# Patient Record
Sex: Female | Born: 2005 | Race: White | Hispanic: No | Marital: Single | Smoking: Never smoker
Health system: Southern US, Community
[De-identification: ages and names within clinical notes are randomized; demographics above are authoritative.]

## PROBLEM LIST (undated history)

## (undated) DIAGNOSIS — G43909 Migraine, unspecified, not intractable, without status migrainosus: Secondary | ICD-10-CM

## (undated) HISTORY — DX: Migraine, unspecified, not intractable, without status migrainosus: G43.909

---
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
## 2023-01-18 ENCOUNTER — Telehealth: Payer: Self-pay

## 2023-01-18 NOTE — Telephone Encounter (Signed)
Pt calling; was seen in Jan to have nexplanon taken out and depo started; Wed of last week she went to the br and got into the shower and a lot of blood and clots came out of her; she changes her tampon q1h; it if old blood on tampon and not bright red; she is not bleeding heavy as tampon isn't filled up; she is still bleeding.  Adv it will take her body a while to adjust to the depo and irreg bleeding can be expected; to monitor and the bleeding; we don't want her to fill up a pad every 70mn to one hour; if she does she needs to go to the ER.  Pt states again of how she bled last Wed in the shower and is still bleeding; adv again that was last Wed and not now to monitor the bleeding.  Pt said, "okay, never mind".

## 2023-02-25 ENCOUNTER — Ambulatory Visit (INDEPENDENT_AMBULATORY_CARE_PROVIDER_SITE_OTHER): Payer: Medicaid Other

## 2023-02-25 VITALS — BP 102/65 | HR 56 | Resp 16 | Ht 60.0 in | Wt 107.3 lb

## 2023-02-25 DIAGNOSIS — Z3042 Encounter for surveillance of injectable contraceptive: Secondary | ICD-10-CM | POA: Diagnosis not present

## 2023-02-25 MED ORDER — MEDROXYPROGESTERONE ACETATE 150 MG/ML IM SUSP
150.0000 mg | Freq: Once | INTRAMUSCULAR | Status: AC
  Administered 2023-02-25: 150 mg via INTRAMUSCULAR

## 2023-02-25 NOTE — Progress Notes (Signed)
    NURSE VISIT NOTE  Subjective:    Patient ID: Elizabeth Mckenzie, female    DOB: 12/31/2005, 17 y.o.   MRN: [id number]  HPI  Patient is a 17 y.o. G0P0000 female who presents for depo provera injection.   Objective:    There were no vitals taken for this visit.  Last Annual: None/Adolescent. Last pap: Not age appropriate. Last Depo-Provera: 12/03/2022. Side Effects if any: none. Serum HCG indicated? No . Depo-Provera 150 mg IM given by: Cristy Folks, CMA. Site: Right Deltoid  Lab Review  @THIS  VISIT ONLY@  Assessment:   1. Encounter for surveillance of injectable contraceptive      Plan:   Next appointment due between June 20 and July 4.    Chilton Greathouse, [facility]

## 2023-02-25 NOTE — Patient Instructions (Signed)

## 2023-03-10 ENCOUNTER — Other Ambulatory Visit: Payer: Self-pay | Admitting: Physician Assistant

## 2023-03-10 DIAGNOSIS — R51 Headache with orthostatic component, not elsewhere classified: Secondary | ICD-10-CM

## 2023-03-10 DIAGNOSIS — R519 Headache, unspecified: Secondary | ICD-10-CM

## 2023-03-10 DIAGNOSIS — R202 Paresthesia of skin: Secondary | ICD-10-CM

## 2023-03-22 ENCOUNTER — Ambulatory Visit
Admission: RE | Admit: 2023-03-22 | Discharge: 2023-03-22 | Disposition: A | Discharge status: Home / Self Care | Payer: Medicaid Other | Source: Ambulatory Visit | Attending: Physician Assistant | Admitting: Physician Assistant

## 2023-03-22 DIAGNOSIS — R519 Headache, unspecified: Secondary | ICD-10-CM

## 2023-03-22 DIAGNOSIS — R202 Paresthesia of skin: Secondary | ICD-10-CM

## 2023-03-22 DIAGNOSIS — R51 Headache with orthostatic component, not elsewhere classified: Secondary | ICD-10-CM

## 2023-03-22 MED ORDER — GADOPICLENOL 0.5 MMOL/ML IV SOLN
5.0000 mL | Freq: Once | INTRAVENOUS | Status: AC | PRN
  Administered 2023-03-22: 5 mL via INTRAVENOUS

## 2023-05-20 ENCOUNTER — Ambulatory Visit (INDEPENDENT_AMBULATORY_CARE_PROVIDER_SITE_OTHER): Payer: Medicaid Other

## 2023-05-20 VITALS — BP 118/62 | HR 80 | Wt 110.6 lb

## 2023-05-20 DIAGNOSIS — Z3042 Encounter for surveillance of injectable contraceptive: Secondary | ICD-10-CM | POA: Diagnosis not present

## 2023-05-20 MED ORDER — MEDROXYPROGESTERONE ACETATE 150 MG/ML IM SUSP
150.0000 mg | Freq: Once | INTRAMUSCULAR | Status: AC
  Administered 2023-05-20: 150 mg via INTRAMUSCULAR

## 2023-05-20 NOTE — Progress Notes (Signed)
    NURSE VISIT NOTE  Subjective:    Patient ID: Elizabeth Mckenzie, female    DOB: 04/05/2006, 17 y.o.   MRN: [id number]  HPI  Patient is a 17 y.o. G0P0000 female who presents for depo provera injection.   Objective:    BP 118/62   Pulse 80   Wt 110 lb 9.6 oz (50.2 kg)   BMI 21.60 kg/m   Last Annual: N/A. Last pap: N/A. Last Depo-Provera: 02/25/2023. Side Effects if any: none. Serum HCG indicated? No . Depo-Provera 150 mg IM given by: Sheliah Hatch, CMA. Site: Left Deltoid  Lab Review    Assessment:   1. Encounter for surveillance of injectable contraceptive      Plan:   Next appointment due between 08/05/23 and 08/19/23.    Fonda Kinder, CMA

## 2023-05-20 NOTE — Patient Instructions (Signed)

## 2023-08-12 ENCOUNTER — Ambulatory Visit: Payer: MEDICAID

## 2023-08-12 VITALS — BP 95/56 | HR 62 | Ht 60.0 in | Wt 110.2 lb

## 2023-08-12 DIAGNOSIS — Z3042 Encounter for surveillance of injectable contraceptive: Secondary | ICD-10-CM | POA: Diagnosis not present

## 2023-08-12 MED ORDER — MEDROXYPROGESTERONE ACETATE 150 MG/ML IM SUSP
150.0000 mg | Freq: Once | INTRAMUSCULAR | Status: AC
Start: 2023-08-12 — End: 2023-08-12
  Administered 2023-08-12: 150 mg via INTRAMUSCULAR

## 2023-08-12 NOTE — Progress Notes (Signed)
NURSE VISIT NOTE  Subjective:    Patient ID: Elizabeth Mckenzie, female    DOB: 2005/12/09, 17 y.o.   MRN: [id number]  HPI  Patient is a 17 y.o. G0P0000 female who presents for depo provera injection.   Objective:    BP (!) 95/56   Pulse 62   Ht 5' (1.524 m)   Wt 110 lb 3.2 oz (50 kg)   BMI 21.52 kg/m   Last Annual: 12/03/22. Last pap: n/a due to age. Last Depo-Provera: 05/20/23. Side Effects if any: n/a. Serum HCG indicated? N/a. Depo-Provera 150 mg IM given by: Georgiana Shore, CMA. Site: Left Deltoid   Assessment:   1. Encounter for surveillance of injectable contraceptive      Plan:   Next appointment due between 10/28/23 and 11/11/23.    Loman Chroman, CMA

## 2023-11-03 ENCOUNTER — Ambulatory Visit: Payer: BC Managed Care – PPO

## 2023-11-03 VITALS — BP 114/77 | HR 76 | Ht 60.0 in | Wt 113.0 lb

## 2023-11-03 DIAGNOSIS — Z3042 Encounter for surveillance of injectable contraceptive: Secondary | ICD-10-CM

## 2023-11-03 DIAGNOSIS — R300 Dysuria: Secondary | ICD-10-CM | POA: Diagnosis not present

## 2023-11-03 LAB — POCT URINALYSIS DIPSTICK
Bilirubin, UA: NEGATIVE
Blood, UA: NEGATIVE
Glucose, UA: NEGATIVE
Ketones, UA: NEGATIVE
Leukocytes, UA: NEGATIVE
Nitrite, UA: NEGATIVE
Protein, UA: POSITIVE — AB
Spec Grav, UA: 1.025 (ref 1.010–1.025)
Urobilinogen, UA: 0.2 U/dL
pH, UA: 6.5 (ref 5.0–8.0)

## 2023-11-03 MED ORDER — MEDROXYPROGESTERONE ACETATE 150 MG/ML IM SUSY
150.0000 mg | PREFILLED_SYRINGE | Freq: Once | INTRAMUSCULAR | Status: AC
Start: 2023-11-03 — End: 2023-11-03
  Administered 2023-11-03: 150 mg via INTRAMUSCULAR

## 2023-11-03 NOTE — Progress Notes (Signed)
    NURSE VISIT NOTE  Subjective:    Patient ID: Elizabeth Mckenzie, female    DOB: [birth year], 17 y.o.   MRN: [id number]  HPI  Patient is a 17 y.o. G0P0000 female who presents for depo provera injection.   Objective:    There were no vitals taken for this visit.  Last Annual: 12/03/22. Last pap: N/A. Last Depo-Provera: 08/12/2023 Side Effects if any: none. Serum HCG indicated? No . Depo-Provera 150 mg IM given by: Beverely Pace, CMA. Site: Right Deltoid While pt was here she sated she was having pain with urination asking for a uti check states she also keeps protein in urine and has referral with Uro, she will call pcp to check on that referral sent urine culture to lab .    Assessment:   1. Surveillance for Depo-Provera contraception   2. Dysuria      Plan:   Next appointment due between FEB-26-MAR-12    Loney Laurence, CMA

## 2023-11-05 LAB — URINE CULTURE

## 2024-01-26 ENCOUNTER — Ambulatory Visit: Payer: BC Managed Care – PPO

## 2024-02-02 ENCOUNTER — Ambulatory Visit: Payer: BC Managed Care – PPO

## 2024-02-02 VITALS — BP 97/56 | HR 63 | Ht 61.0 in | Wt 120.1 lb

## 2024-02-02 DIAGNOSIS — Z3042 Encounter for surveillance of injectable contraceptive: Secondary | ICD-10-CM | POA: Diagnosis not present

## 2024-02-02 MED ORDER — MEDROXYPROGESTERONE ACETATE 150 MG/ML IM SUSP
150.0000 mg | Freq: Once | INTRAMUSCULAR | Status: AC
Start: 2024-02-02 — End: 2024-02-02
  Administered 2024-02-02: 150 mg via INTRAMUSCULAR

## 2024-02-02 NOTE — Progress Notes (Signed)
    NURSE VISIT NOTE  Subjective:    Patient ID: Elizabeth Mckenzie, female    DOB: 2006-05-28, 18 y.o.   MRN: [id number]  HPI  Patient is a 18 y.o. G0P0000 female who presents for depo provera injection.   Objective:    BP (!) 97/56   Pulse 63   Ht 5\' 1"  (1.549 m)   Wt 120 lb 1.6 oz (54.5 kg)   BMI 22.69 kg/m   Last Annual: N/A. Last pap: N/A. Last Depo-Provera: 11/03/23. Side Effects if any: none. Serum HCG indicated? No . Depo-Provera 150 mg IM given by: Sheliah Hatch, CMA. Site: Left Deltoid  Lab Review    Assessment:   1. Surveillance for Depo-Provera contraception      Plan:   Next appointment due between 04/19/2024 and 05/03/2024.    Fonda Kinder, CMA

## 2024-02-02 NOTE — Patient Instructions (Signed)

## 2024-04-26 ENCOUNTER — Ambulatory Visit

## 2024-08-21 NOTE — Progress Notes (Signed)
" ° ° °  UROLOGY PROCEDURE VISIT  Procedure:  Local Cystourethroscopy  Code: Cystoscopy, CPT 52000    Indication: 18 y.o. female here for gross hematuria, last imaging 06/2024 without evidence of stones or hydronephrosis (non-contrast study), no previous cytology . On further review of patient history, she provided pictures demonstrating clot-like material in the toilet with clear yellow urine, reports no history of true gross hematuria. Has had menstrual cycle abnormalities followed by GYN.  Description:  Time out was performed immediately prior to the procedure.   The patient was prepped and draped in the usual sterile fashion in the relaxed dorsal lithotomy position.  Flexible cystoscopy was performed.  The urethral meatus and urethra were normal.  The bladder appeared smooth, pale, and uniformly pink, with no evidence of tumors, polyps, or other abnormalities detected. Ureteral orifices were visualized bilaterally.  Saline barbotage was performed.  The patient tolerated the procedure well, and she did not require prophylactic antibiotics according to AUA guidelines.SABRA  Specimen: none  Assessment: Normal cystoscopy  Plan:   -Presentation more consistent with GYN bleeding than gross hematuria, normal cystoscopy -No follow up indicated  Arthea Rail, MD Department of Urology  "

## 2024-11-03 ENCOUNTER — Ambulatory Visit: Admitting: Licensed Practical Nurse

## 2024-11-24 ENCOUNTER — Ambulatory Visit (INDEPENDENT_AMBULATORY_CARE_PROVIDER_SITE_OTHER): Admitting: Licensed Practical Nurse

## 2024-11-24 ENCOUNTER — Other Ambulatory Visit (HOSPITAL_COMMUNITY)
Admission: RE | Admit: 2024-11-24 | Discharge: 2024-11-24 | Disposition: A | Discharge status: Home / Self Care | Source: Ambulatory Visit | Attending: Licensed Practical Nurse | Admitting: Licensed Practical Nurse

## 2024-11-24 ENCOUNTER — Encounter: Payer: Self-pay | Admitting: Licensed Practical Nurse

## 2024-11-24 VITALS — BP 109/71 | HR 83 | Wt 106.7 lb

## 2024-11-24 DIAGNOSIS — Z113 Encounter for screening for infections with a predominantly sexual mode of transmission: Secondary | ICD-10-CM | POA: Diagnosis present

## 2024-11-24 DIAGNOSIS — N926 Irregular menstruation, unspecified: Secondary | ICD-10-CM

## 2024-11-24 NOTE — Progress Notes (Signed)
 "   [name], MD   No chief complaint on file.   HPI:      Elizabeth Mckenzie is a 19 y.o. G0P0000 whose LMP was Patient's last menstrual period was 11/10/2024 (approximate)., presents today for hormone check saw something on tik tok that  scared her, wonders if she has PCOS because her cycles are weird. Also desires STD screening.    Menarche 5th grade When not on hormones cycles were heavy and  has significant cramping   Was on Depo Jan 2024 through March 2025, did not get cycle June July, August or September 2025, passed blood clots a few times a week while on Depo,  Cycles returned in October.   Oct 19-15, Nov 1-4, Nov 22-25, Dec 19-23, flow varies and number of menstrual days vary, gets cramp in her back and bloating around the time of her cycle, also bloats often, has increased fruits and veggies,  eats burgers, chicken, drinks milk and eat sweats, Has 3-4 BM daily, stool is loose sometimes needs to strain  PCP [facility] in [place]   Sexually active with 1 female partner, does not desires a pregnancy but is not using contraception, is allergic to latex   Denies excessive facial hair, deepening of voice, or female pattern baldness  Normal Pelvic US  11/2022    -Has decreased sedx dirve since stopping birth control  Sleep is disturbed-wakes in the middle of night often  Works at [employer] and goes to school for business      There are no active problems to display for this patient.   No past surgical history on file.  Family History  Problem Relation Age of Onset   Hypertension Maternal Grandmother    Breast cancer Neg Hx    Ovarian cancer Neg Hx    Colon cancer Neg Hx     Social History   Socioeconomic History   Marital status: Single    Spouse name: Not on file   Number of children: Not on file   Years of education: Not on file   Highest education level: Not on file  Occupational History   Not on file  Tobacco Use   Smoking status: Never    Smokeless tobacco: Never  Vaping Use   Vaping status: Never Used  Substance and Sexual Activity   Alcohol use: Never   Drug use: Never   Sexual activity: Yes    Birth control/protection: None  Other Topics Concern   Not on file  Social History Narrative   Not on file   Social Drivers of Health   Tobacco Use: Low Risk (11/24/2024)   Patient History    Smoking Tobacco Use: Never    Smokeless Tobacco Use: Never    Passive Exposure: Not on file  Financial Resource Strain: Not on file  Food Insecurity: Not on file  Transportation Needs: Not on file  Physical Activity: Not on file  Stress: Not on file  Social Connections: Unknown (10/23/2022)   Received from [facility]   Social Network    Social Network: Not on file  Intimate Partner Violence: Unknown (10/23/2022)   Received from [facility]   HITS    Physically Hurt: Not on file    Insult or Talk Down To: Not on file    Threaten Physical Harm: Not on file    Scream or Curse: Not on file  Depression (PHQ2-9): Low Risk (01/07/2022)   Depression (PHQ2-9)    PHQ-2 Score: 0  Alcohol Screen: Not on file  Housing: Not on file  Utilities: Not on file  Health Literacy: Not on file    Outpatient Medications Prior to Visit  Medication Sig Dispense Refill   medroxyPROGESTERone  (DEPO-PROVERA ) 150 MG/ML injection Inject into the muscle. (Patient not taking: Reported on 11/24/2024)     No facility-administered medications prior to visit.      ROS:  Review of Systems see HPI    OBJECTIVE:   Vitals:  BP 109/71   Pulse 83   Wt 106 lb 11.2 oz (48.4 kg)   LMP 11/10/2024 (Approximate)   BMI 20.16 kg/m   Physical Exam Constitutional:      Appearance: Normal appearance.  Pulmonary:     Effort: Pulmonary effort is normal.  Abdominal:     General: Abdomen is flat. There is no distension.     Palpations: There is no mass.     Tenderness: There is no abdominal tenderness. There is no guarding.  Genitourinary:    General:  Normal vulva.     Comments: SSE: cervix pink, no lesions or bleeding  Bimanual exam: uterus non gravid, non tender no masses, adnexa non tender no masses not enlarged  Musculoskeletal:     Cervical back: Normal range of motion.  Skin:    General: Skin is warm.  Neurological:     General: No focal deficit present.     Mental Status: She is alert.  Psychiatric:        Mood and Affect: Mood normal.        Thought Content: Thought content normal.     Results: No results found for this or any previous visit (from the past 24 hours).   Assessment/Plan: 1. Screening examination for STD (sexually transmitted disease) (Primary) - Cervicovaginal ancillary only - HEP, RPR, HIV Panel  2. Irregular periods - TSH+Prl+FSH+TestT+LH+DHEA S...     No orders of the defined types were placed in this encounter.  -there is no clear explanation for her cycles, could be related to adapting to no longer being on Depo, her Pelvic US  was normal 1 year ago, will defer another US .  -Briefly discussed birth control options, please return if you are interested.  -Bloating could happen for a variety of reasons (today your abdomen is flat), could be related to your eating and bowel habits and or cycle, please see your PCP if it persists.  -Sex drive could be related to a variety of reasons,   JINNIE HERO [facility], CNM 11/24/2024 4:53 PM      "

## 2024-11-27 LAB — HEP, RPR, HIV PANEL
HIV Screen 4th Generation wRfx: NONREACTIVE
Hepatitis B Surface Ag: NEGATIVE
RPR Ser Ql: NONREACTIVE

## 2024-11-27 LAB — CERVICOVAGINAL ANCILLARY ONLY
Bacterial Vaginitis (gardnerella): NEGATIVE
Candida Glabrata: NEGATIVE
Candida Vaginitis: NEGATIVE
Chlamydia: NEGATIVE
Comment: NEGATIVE
Comment: NEGATIVE
Comment: NEGATIVE
Comment: NEGATIVE
Comment: NEGATIVE
Comment: NORMAL
Neisseria Gonorrhea: NEGATIVE
Trichomonas: NEGATIVE

## 2024-11-27 LAB — TSH+PRL+FSH+TESTT+LH+DHEA S...
17-Hydroxyprogesterone: 113 ng/dL
Androstenedione: 132 ng/dL (ref 41–262)
DHEA-SO4: 260 ug/dL (ref 110.0–431.7)
FSH: 13.3 m[IU]/mL
LH: 37.6 m[IU]/mL
Prolactin: 19.5 ng/mL (ref 4.8–33.4)
TSH: 1.24 u[IU]/mL (ref 0.450–4.500)
Testosterone, Free: 1.3 pg/mL (ref 0.0–4.2)
Testosterone: 36 ng/dL (ref 13–71)

## 2024-12-02 ENCOUNTER — Ambulatory Visit: Payer: Self-pay | Admitting: Licensed Practical Nurse

## 2024-12-18 ENCOUNTER — Ambulatory Visit: Admitting: Licensed Practical Nurse

## 2024-12-22 ENCOUNTER — Ambulatory Visit: Admitting: Licensed Practical Nurse

## 2024-12-22 ENCOUNTER — Encounter: Payer: Self-pay | Admitting: Licensed Practical Nurse

## 2024-12-22 VITALS — BP 107/71 | HR 65 | Ht 60.0 in | Wt 104.7 lb

## 2024-12-22 DIAGNOSIS — E282 Polycystic ovarian syndrome: Secondary | ICD-10-CM

## 2024-12-22 MED ORDER — DESOGESTREL-ETHINYL ESTRADIOL 0.15-0.02/0.01 MG (21/5) PO TABS: 1.0000 | ORAL_TABLET | Freq: Every day | ORAL | 11 refills | Status: AC

## 2024-12-22 MED ORDER — DESOGESTREL-ETHINYL ESTRADIOL 0.15-0.02/0.01 MG (21/5) PO TABS: 1.0000 | ORAL_TABLET | Freq: Every day | ORAL | 11 refills | Status: DC

## 2024-12-22 NOTE — Patient Instructions (Signed)
 How to Use Birth Control Pills (Oral Contraceptives) Oral contraceptive pills, or birth control pills, are medicines that prevent pregnancy. They work by: Preventing the ovaries from releasing eggs. Thickening mucus in the lower part of the uterus called the cervix. This prevents sperm from getting in the uterus. Thinning the lining of the uterus. This prevents a fertilized egg from attaching to the lining. Talk about possible side effects with your health care provider. It can take 2-3 months for your body to adjust to changes in hormone levels. What are the risks? Birth control pills can sometimes cause side effects, such as: Headache. Depression. Trouble sleeping. Nausea and throwing up, bloating, or fluid retention. Breast tenderness. Irregular bleeding or spotting during the first several months. Increase in blood pressure. Birth control pills with estrogen and progestins may slightly increase the risk of: Blood clots. Heart attack. Stroke. How to take birth control pills Follow instructions from your provider about how to take your first cycle of birth control pills. There are two types of pills: Combination birth control pills. These have both estrogen and progestin in them. For combination pills, you may start the pill: On day 1 of your menstrual period. On the first Sunday after your period starts, or on the day you get your pills. At any time of your cycle. If you start taking the pill within 5 days after the start of your period, you will not need a backup form of birth control, such as condoms. If you start at any other time of your menstrual cycle, you will need to use a backup form of birth control. Progestin-only pills. These are also called mini-pills. These have only progestin in them. For progestin-only pills: Ideally, you can start taking the pill on the first day of your menstrual period, but you can start on any other day too. These pills will protect you from  pregnancy after taking it for 2 days (48 hours). You can stop using a backup form of birth control after that time. You need to take this pill at the same time every day. Even taking it 3 hours late can increase the risk of pregnancy. No matter which day you start the pills, you will always start a new pack on that same day of the week. Have an extra pack of pills and a backup contraceptive method in case you miss some pills or lose your pill pack. Missed doses Follow instructions from your provider for missed doses. What to do about missed doses can also be found in the information that comes with your pack of pills. In general, for combined pills: If you forget to take the pill for 1 day, take it as soon as you can. This may mean taking 2 pills on the same day and at the same time. Take the next day's pill at the regular time. If you forget to take the pill for 2 days in a row, take 2 pills on the day you remember and 2 pills on the following day. A backup form of birth control should be used for 7 days after you're back on schedule. If you forget to take the pill for 3 days in a row, call your provider for directions on when to restart taking your pills. Do not take the missed pills. A backup form of birth control will be needed for 7 days once you restart your pills. If you use a pack that contains inactive pills and you miss 1 or more of the inactive  pills, you do not need to take the missed doses. Skip them and start the new pack on the regular day. For progestin-only pills: If your dose is 3 hours or more late, or if you miss 1 or more doses, take 1 missed pill as soon as you can. If you miss 1 or more doses, you must use a backup form of birth control. Some brands of progestin-only pills recommend using a backup form of birth control for 48 hours after a missed or late dose while others recommend 7 days. If you're not sure what to do, call your provider or check the information that came with your  pills. Follow these instructions at home: Do not smoke, vape, or use nicotine or tobacco. Always use a condom to protect against sexually transmitted infections (STIs). Birth control pills do not protect against STIs. Use a calendar to mark the days of your menstrual period. Read the information and directions that came with your pills. Talk to your provider if you have questions. Contact a health care provider if: You have discharge or bleeding from your vagina that's not normal. You develop a rash, you're losing your hair, or you develop acne after taking the pills. You miss your menstrual period. Depending on the type of pills you're taking, this may be a sign of pregnancy. You need treatment for mood swings or depression. You get dizzy when taking the pills. You become pregnant or think you may be pregnant. You feel like you may throw up or you throw up. You have diarrhea, trouble pooping (constipation), and belly pain or cramps. You're not sure what to do after missing pills. Get help right away if: You develop chest pain. You develop shortness of breath. You have a very bad headache. You develop numbness or slurred speech. You develop vision problems. You develop pain, redness, and swelling in your legs. You develop weakness or numbness in your arms or legs. You develop right upper belly pain, loss of appetite, you feel like you may throw up, and you have light-colored poop. You develop dark yellow or brown pee (urine), yellowing skin or eyes, or unusual weakness or tiredness. You develop new or worsening migraines or headaches. These symptoms may be an emergency. Call 911 right away. Do not wait to see if the symptoms will go away. Do not drive yourself to the hospital. This information is not intended to replace advice given to you by your health care provider. Make sure you discuss any questions you have with your health care provider. Document Revised: 05/23/2023 Document  Reviewed: 05/23/2023 Elsevier Patient Education  2024 [employer].

## 2024-12-29 ENCOUNTER — Other Ambulatory Visit

## 2024-12-29 DIAGNOSIS — E282 Polycystic ovarian syndrome: Secondary | ICD-10-CM
